# Patient Record
Sex: Male | Born: 1965 | Race: White | Hispanic: No | Marital: Married
Health system: Southern US, Community
[De-identification: ages and names within clinical notes are randomized; demographics above are authoritative.]

---

## 2002-09-24 ENCOUNTER — Ambulatory Visit (HOSPITAL_BASED_OUTPATIENT_CLINIC_OR_DEPARTMENT_OTHER): Admission: RE | Admit: 2002-09-24 | Discharge: 2002-09-24 | Payer: Self-pay | Admitting: Orthopedic Surgery

## 2013-06-08 ENCOUNTER — Other Ambulatory Visit: Payer: Self-pay | Admitting: Family Medicine

## 2013-06-08 DIAGNOSIS — R945 Abnormal results of liver function studies: Principal | ICD-10-CM

## 2013-06-08 DIAGNOSIS — R7989 Other specified abnormal findings of blood chemistry: Secondary | ICD-10-CM

## 2013-06-11 ENCOUNTER — Ambulatory Visit
Admission: RE | Admit: 2013-06-11 | Discharge: 2013-06-11 | Disposition: A | Discharge status: Home / Self Care | Payer: 59 | Source: Ambulatory Visit | Attending: Family Medicine | Admitting: Family Medicine

## 2013-06-11 DIAGNOSIS — R945 Abnormal results of liver function studies: Principal | ICD-10-CM

## 2013-06-11 DIAGNOSIS — R7989 Other specified abnormal findings of blood chemistry: Secondary | ICD-10-CM

## 2015-01-11 IMAGING — US US ABDOMEN COMPLETE
1 series · 14 of 25 positions shown · non-contrast
Comparison: CT abdomen pelvis dated 06/10/2012

CLINICAL DATA: Elevated LFTs

EXAM:
ULTRASOUND ABDOMEN COMPLETE

[Series 1: us abdomen complete · 0.41mm/px · 14 of 68 slices shown]
[im 1/68]
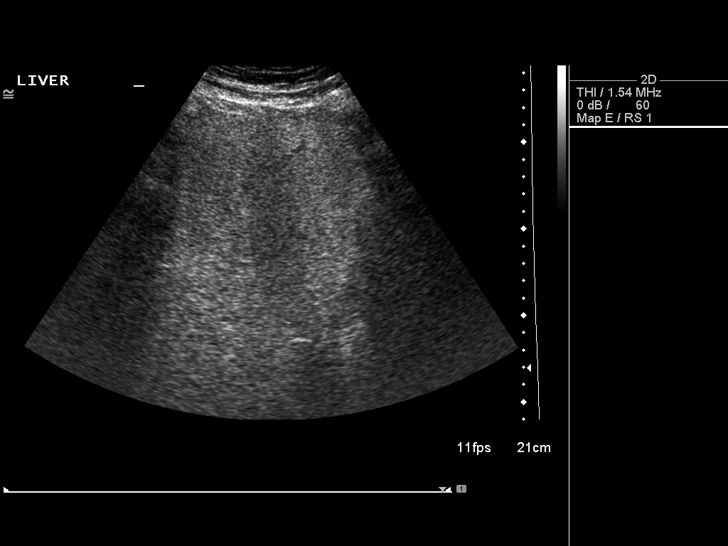
[im 6/68]
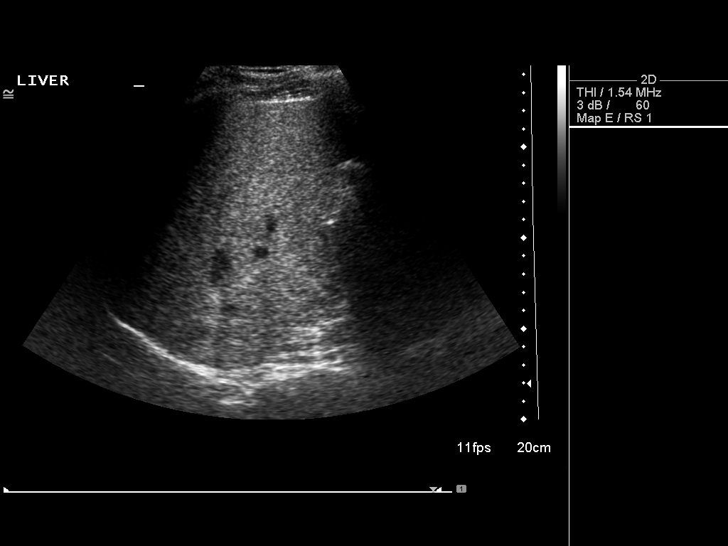
[im 12/68]
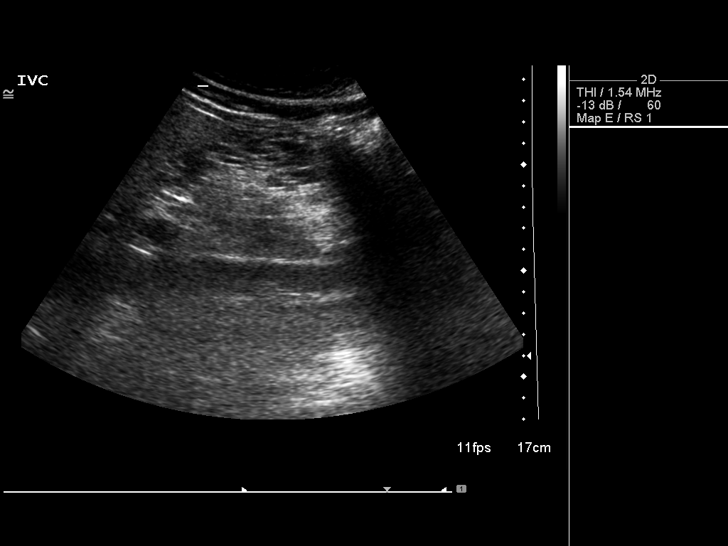
[im 17/68]
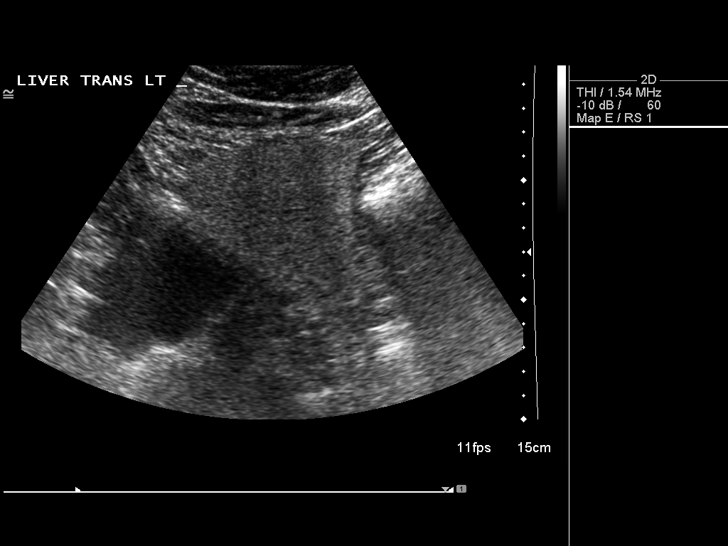
[im 23/68]
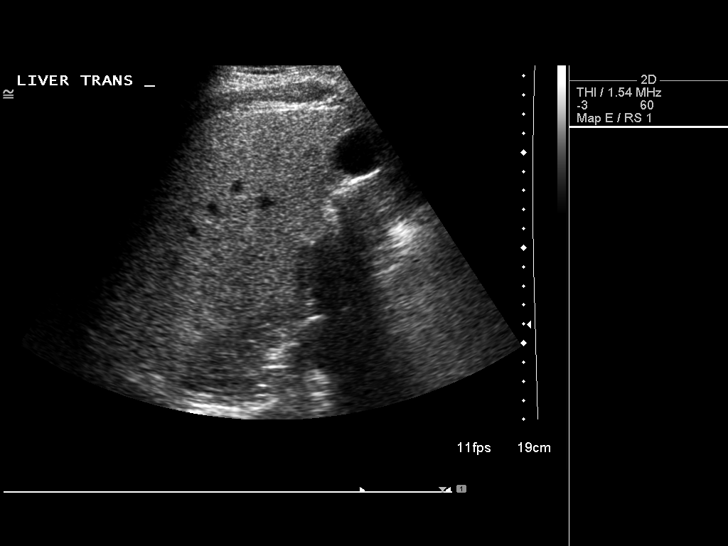
[im 26/68]
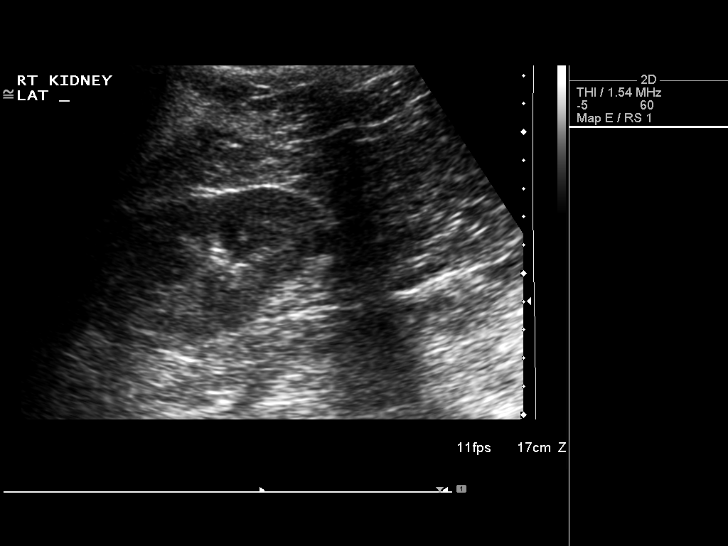
[im 31/68]
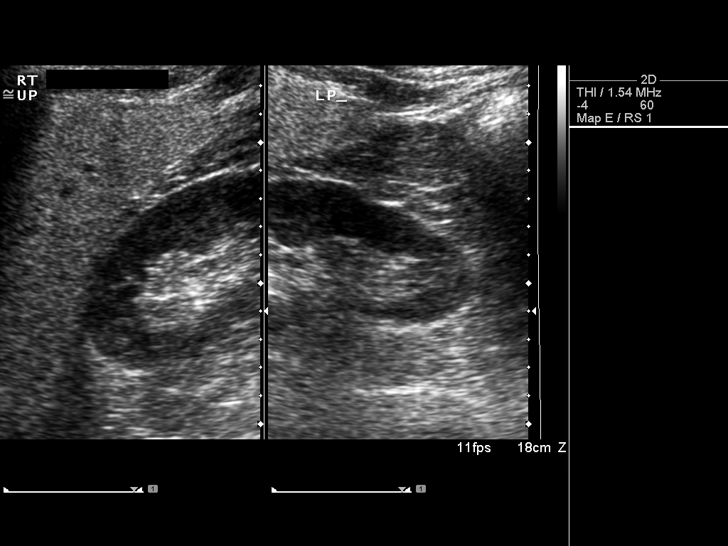
[im 37/68]
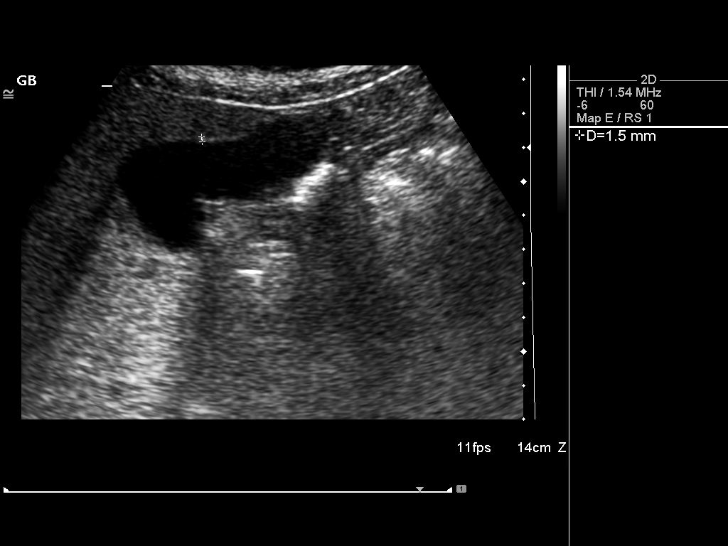
[im 42/68]
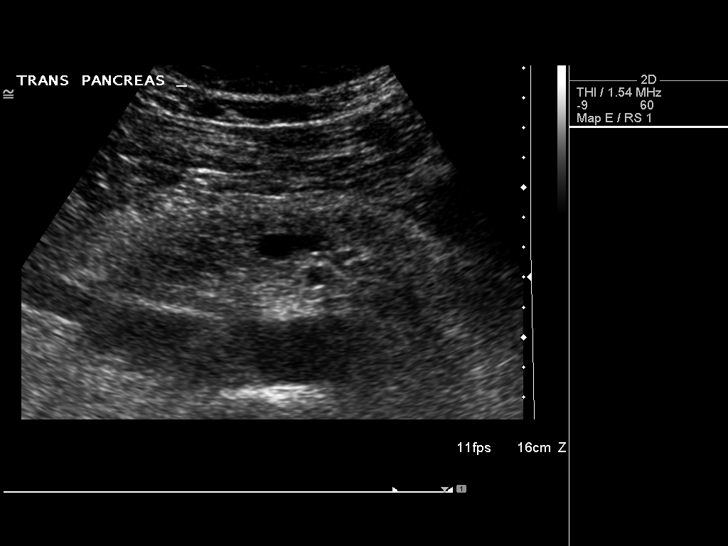
[im 45/68]
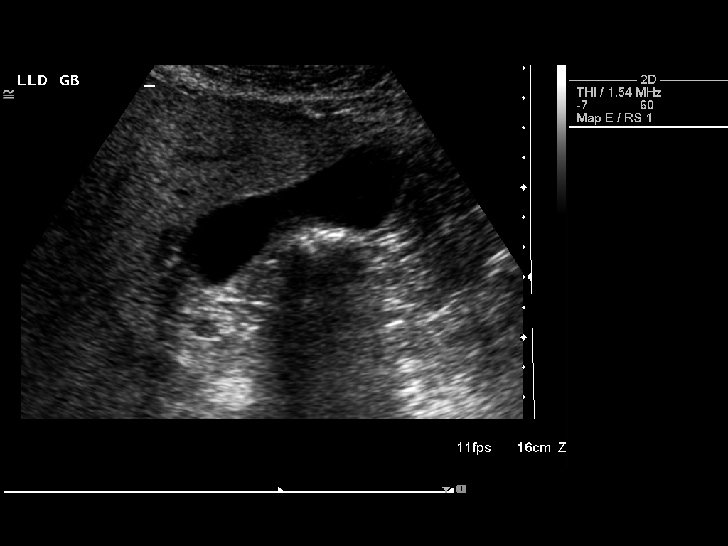
[im 51/68]
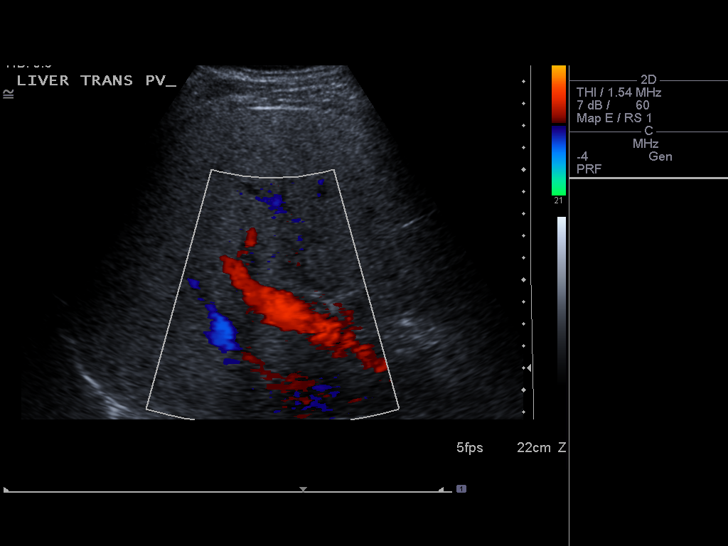
[im 56/68]
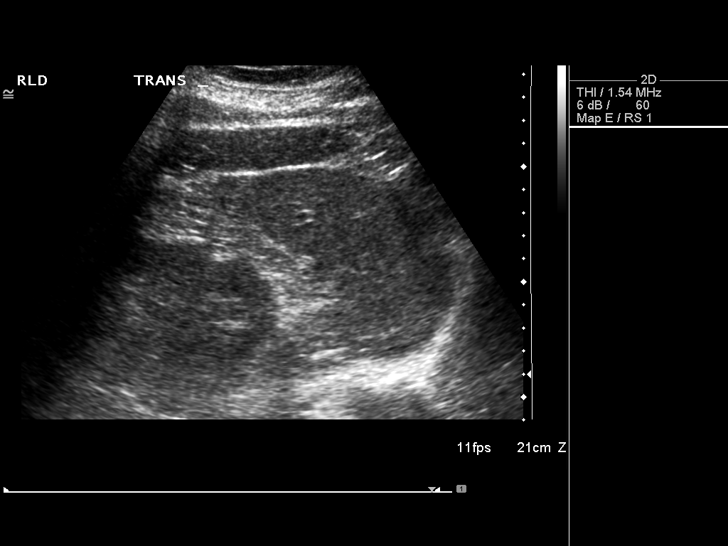
[im 62/68]
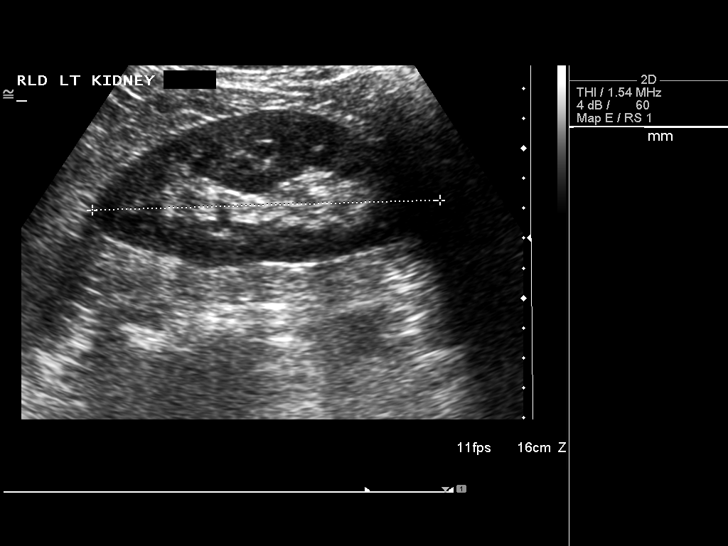
[im 68/68]
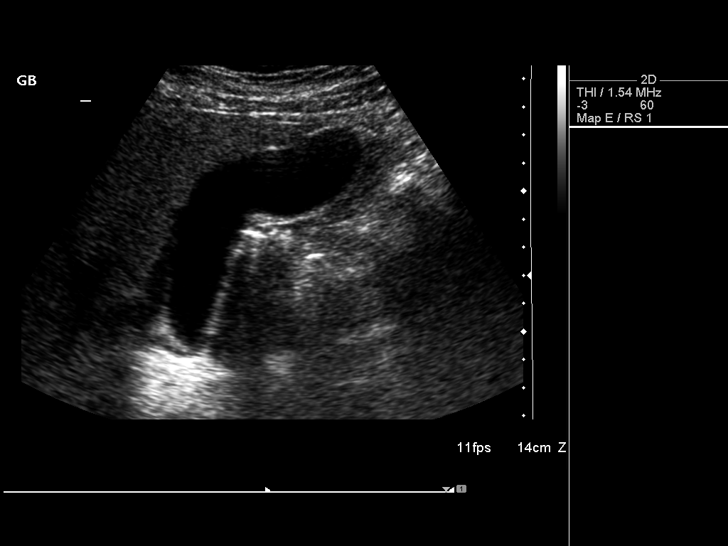

[14 of 25 positions shown; findings below may reference images not displayed]

FINDINGS: Gallbladder:

No gallstones, gallbladder wall thickening, or pericholecystic
fluid. Negative sonographic Murphy's sign.

Common bile duct:

Diameter: 3 mm.

Liver:

Hyperechoic hepatic parenchyma, reflecting hepatic steatosis. No
focal hepatic lesion is seen.

IVC:

No abnormality visualized.

Pancreas:

Visualized portions are unremarkable.

Spleen:

Measures 9.7 cm.

Right Kidney:

Length: 12.0 cm.  No mass or hydronephrosis.

Left Kidney:

Length: 12.1 cm.  No mass or hydronephrosis.

Abdominal aorta:

No aneurysm visualized.

Other findings:

None.
IMPRESSION: Hepatic steatosis.
